# Patient Record
Sex: Female | Born: 2000
Health system: Southern US, Community
[De-identification: ages and names within clinical notes are randomized; demographics above are authoritative.]

## PROBLEM LIST (undated history)

## (undated) DIAGNOSIS — G540 Brachial plexus disorders: Secondary | ICD-10-CM

---
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
## 2019-07-05 DIAGNOSIS — Z419 Encounter for procedure for purposes other than remedying health state, unspecified: Secondary | ICD-10-CM | POA: Diagnosis not present

## 2019-08-05 DIAGNOSIS — Z419 Encounter for procedure for purposes other than remedying health state, unspecified: Secondary | ICD-10-CM | POA: Diagnosis not present

## 2019-09-05 DIAGNOSIS — Z419 Encounter for procedure for purposes other than remedying health state, unspecified: Secondary | ICD-10-CM | POA: Diagnosis not present

## 2019-10-05 DIAGNOSIS — Z419 Encounter for procedure for purposes other than remedying health state, unspecified: Secondary | ICD-10-CM | POA: Diagnosis not present

## 2019-11-05 DIAGNOSIS — Z419 Encounter for procedure for purposes other than remedying health state, unspecified: Secondary | ICD-10-CM | POA: Diagnosis not present

## 2019-12-05 DIAGNOSIS — Z419 Encounter for procedure for purposes other than remedying health state, unspecified: Secondary | ICD-10-CM | POA: Diagnosis not present

## 2020-01-05 DIAGNOSIS — Z419 Encounter for procedure for purposes other than remedying health state, unspecified: Secondary | ICD-10-CM | POA: Diagnosis not present

## 2020-02-05 DIAGNOSIS — Z419 Encounter for procedure for purposes other than remedying health state, unspecified: Secondary | ICD-10-CM | POA: Diagnosis not present

## 2020-03-04 DIAGNOSIS — Z419 Encounter for procedure for purposes other than remedying health state, unspecified: Secondary | ICD-10-CM | POA: Diagnosis not present

## 2020-04-04 DIAGNOSIS — Z419 Encounter for procedure for purposes other than remedying health state, unspecified: Secondary | ICD-10-CM | POA: Diagnosis not present

## 2020-05-04 DIAGNOSIS — Z419 Encounter for procedure for purposes other than remedying health state, unspecified: Secondary | ICD-10-CM | POA: Diagnosis not present

## 2020-06-04 DIAGNOSIS — Z419 Encounter for procedure for purposes other than remedying health state, unspecified: Secondary | ICD-10-CM | POA: Diagnosis not present

## 2020-07-04 DIAGNOSIS — Z419 Encounter for procedure for purposes other than remedying health state, unspecified: Secondary | ICD-10-CM | POA: Diagnosis not present

## 2020-08-04 DIAGNOSIS — Z419 Encounter for procedure for purposes other than remedying health state, unspecified: Secondary | ICD-10-CM | POA: Diagnosis not present

## 2020-09-04 DIAGNOSIS — Z419 Encounter for procedure for purposes other than remedying health state, unspecified: Secondary | ICD-10-CM | POA: Diagnosis not present

## 2020-10-04 DIAGNOSIS — Z419 Encounter for procedure for purposes other than remedying health state, unspecified: Secondary | ICD-10-CM | POA: Diagnosis not present

## 2020-11-04 DIAGNOSIS — Z419 Encounter for procedure for purposes other than remedying health state, unspecified: Secondary | ICD-10-CM | POA: Diagnosis not present

## 2020-12-04 DIAGNOSIS — Z419 Encounter for procedure for purposes other than remedying health state, unspecified: Secondary | ICD-10-CM | POA: Diagnosis not present

## 2021-01-04 DIAGNOSIS — Z419 Encounter for procedure for purposes other than remedying health state, unspecified: Secondary | ICD-10-CM | POA: Diagnosis not present

## 2021-02-04 DIAGNOSIS — Z419 Encounter for procedure for purposes other than remedying health state, unspecified: Secondary | ICD-10-CM | POA: Diagnosis not present

## 2021-03-04 DIAGNOSIS — Z419 Encounter for procedure for purposes other than remedying health state, unspecified: Secondary | ICD-10-CM | POA: Diagnosis not present

## 2021-04-04 DIAGNOSIS — Z419 Encounter for procedure for purposes other than remedying health state, unspecified: Secondary | ICD-10-CM | POA: Diagnosis not present

## 2021-05-04 DIAGNOSIS — Z419 Encounter for procedure for purposes other than remedying health state, unspecified: Secondary | ICD-10-CM | POA: Diagnosis not present

## 2021-06-04 DIAGNOSIS — Z419 Encounter for procedure for purposes other than remedying health state, unspecified: Secondary | ICD-10-CM | POA: Diagnosis not present

## 2021-07-04 DIAGNOSIS — Z419 Encounter for procedure for purposes other than remedying health state, unspecified: Secondary | ICD-10-CM | POA: Diagnosis not present

## 2021-08-04 DIAGNOSIS — Z419 Encounter for procedure for purposes other than remedying health state, unspecified: Secondary | ICD-10-CM | POA: Diagnosis not present

## 2021-09-04 DIAGNOSIS — Z419 Encounter for procedure for purposes other than remedying health state, unspecified: Secondary | ICD-10-CM | POA: Diagnosis not present

## 2021-10-04 DIAGNOSIS — Z419 Encounter for procedure for purposes other than remedying health state, unspecified: Secondary | ICD-10-CM | POA: Diagnosis not present

## 2021-11-04 DIAGNOSIS — Z419 Encounter for procedure for purposes other than remedying health state, unspecified: Secondary | ICD-10-CM | POA: Diagnosis not present

## 2021-12-04 DIAGNOSIS — Z419 Encounter for procedure for purposes other than remedying health state, unspecified: Secondary | ICD-10-CM | POA: Diagnosis not present

## 2022-01-04 DIAGNOSIS — Z419 Encounter for procedure for purposes other than remedying health state, unspecified: Secondary | ICD-10-CM | POA: Diagnosis not present

## 2022-02-04 DIAGNOSIS — Z419 Encounter for procedure for purposes other than remedying health state, unspecified: Secondary | ICD-10-CM | POA: Diagnosis not present

## 2022-02-20 DIAGNOSIS — R001 Bradycardia, unspecified: Secondary | ICD-10-CM | POA: Diagnosis not present

## 2022-02-20 DIAGNOSIS — N201 Calculus of ureter: Secondary | ICD-10-CM | POA: Diagnosis not present

## 2022-02-20 DIAGNOSIS — R109 Unspecified abdominal pain: Secondary | ICD-10-CM | POA: Diagnosis not present

## 2022-02-20 DIAGNOSIS — R1084 Generalized abdominal pain: Secondary | ICD-10-CM | POA: Diagnosis not present

## 2022-02-20 DIAGNOSIS — R55 Syncope and collapse: Secondary | ICD-10-CM | POA: Diagnosis not present

## 2022-02-20 DIAGNOSIS — Z743 Need for continuous supervision: Secondary | ICD-10-CM | POA: Diagnosis not present

## 2022-03-05 DIAGNOSIS — Z419 Encounter for procedure for purposes other than remedying health state, unspecified: Secondary | ICD-10-CM | POA: Diagnosis not present

## 2022-04-05 DIAGNOSIS — Z419 Encounter for procedure for purposes other than remedying health state, unspecified: Secondary | ICD-10-CM | POA: Diagnosis not present

## 2022-05-05 DIAGNOSIS — Z419 Encounter for procedure for purposes other than remedying health state, unspecified: Secondary | ICD-10-CM | POA: Diagnosis not present

## 2023-02-21 ENCOUNTER — Encounter (HOSPITAL_COMMUNITY): Payer: Self-pay

## 2023-02-21 ENCOUNTER — Emergency Department (HOSPITAL_COMMUNITY)
Admission: EM | Admit: 2023-02-21 | Discharge: 2023-02-21 | Disposition: A | Discharge status: Home / Self Care | Payer: Self-pay | Attending: Emergency Medicine | Admitting: Emergency Medicine

## 2023-02-21 ENCOUNTER — Emergency Department (HOSPITAL_COMMUNITY): Payer: Self-pay

## 2023-02-21 ENCOUNTER — Other Ambulatory Visit: Payer: Self-pay

## 2023-02-21 DIAGNOSIS — R5383 Other fatigue: Secondary | ICD-10-CM

## 2023-02-21 DIAGNOSIS — R0789 Other chest pain: Secondary | ICD-10-CM | POA: Insufficient documentation

## 2023-02-21 DIAGNOSIS — G43809 Other migraine, not intractable, without status migrainosus: Secondary | ICD-10-CM

## 2023-02-21 DIAGNOSIS — R079 Chest pain, unspecified: Secondary | ICD-10-CM

## 2023-02-21 DIAGNOSIS — R040 Epistaxis: Secondary | ICD-10-CM

## 2023-02-21 HISTORY — DX: Brachial plexus disorders: G54.0

## 2023-02-21 LAB — TROPONIN I (HIGH SENSITIVITY)
Troponin I (High Sensitivity): <2 ng/L (ref <18)
Troponin I (High Sensitivity): <2 ng/L (ref <18)

## 2023-02-21 LAB — BASIC METABOLIC PANEL
Anion gap: 13 (ref 5–15)
BUN: 10 mg/dL (ref 6–20)
CO2: 21 mmol/L — ABNORMAL LOW (ref 22–32)
Calcium: 9.7 mg/dL (ref 8.9–10.3)
Chloride: 105 mmol/L (ref 98–111)
Creatinine, Ser: 0.62 mg/dL (ref 0.44–1.00)
GFR, Estimated: >60 mL/min (ref >60)
Glucose, Bld: 83 mg/dL (ref 70–99)
Potassium: 4.1 mmol/L (ref 3.5–5.1)
Sodium: 139 mmol/L (ref 135–145)

## 2023-02-21 LAB — CBC
HCT: 39.2 % (ref 36.0–46.0)
Hemoglobin: 13.3 g/dL (ref 12.0–15.0)
MCH: 29.8 pg (ref 26.0–34.0)
MCHC: 33.9 g/dL (ref 30.0–36.0)
MCV: 87.7 fL (ref 80.0–100.0)
Platelets: 377 10*3/uL (ref 150–400)
RBC: 4.47 MIL/uL (ref 3.87–5.11)
RDW: 12.1 % (ref 11.5–15.5)
WBC: 9.2 10*3/uL (ref 4.0–10.5)
nRBC: 0 % (ref 0.0–0.2)

## 2023-02-21 LAB — HCG, SERUM, QUALITATIVE: Preg, Serum: NEGATIVE

## 2023-02-21 MED ORDER — SUMATRIPTAN SUCCINATE 100 MG PO TABS: 100.0000 mg | ORAL_TABLET | ORAL | 0 refills | Status: AC | PRN

## 2023-02-21 NOTE — ED Provider Notes (Signed)
 [facility] AT [facility] Provider Note   CSN: [id number] Arrival date & time: 02/21/23  1210     History  Chief Complaint  Patient presents with   Fatigue    Tamara Leon is a 23 y.o. female presenting with fatigue and nosebleed and headaches.  Patient states that she has been very fatigued for the last several months.  She states that she had over 6 nosebleeds over the last several months and she feels very tired after them.  Patient states that she has some chest tightness and headache as well going on for several days.  She went to urgent care was sent here for further evaluation.   The history is provided by the patient.       Home Medications Prior to Admission medications   Medication Sig Start Date End Date Taking? Authorizing Provider  methocarbamol (ROBAXIN) 500 MG tablet Take 1 tablet (500 mg total) by mouth 2 (two) times daily. 05/27/15   Joycie Peek, PA-C      Allergies    Patient has no known allergies.    Review of Systems   Review of Systems  Constitutional:  Positive for fatigue.  Neurological:  Positive for headaches.  All other systems reviewed and are negative.   Physical Exam Updated Vital Signs BP 138/89 (BP Location: Right Arm)   Pulse 72   Temp 98 F (36.7 C) (Oral)   Resp 16   Ht 5\' 4"  (1.626 m)   Wt 66.2 kg   SpO2 100%   BMI 25.06 kg/m  Physical Exam Vitals and nursing note reviewed.  Constitutional:      Appearance: Normal appearance.     Comments: Well-appearing  HENT:     Head: Normocephalic.     Nose: Nose normal.     Comments: No active nosebleed    Mouth/Throat:     Mouth: Mucous membranes are moist.  Eyes:     Extraocular Movements: Extraocular movements intact.     Pupils: Pupils are equal, round, and reactive to light.  Cardiovascular:     Rate and Rhythm: Normal rate and regular rhythm.     Pulses: Normal pulses.     Heart sounds: Normal heart sounds.  Pulmonary:     Effort:  Pulmonary effort is normal.     Breath sounds: Normal breath sounds.  Abdominal:     General: Abdomen is flat.     Palpations: Abdomen is soft.  Musculoskeletal:        General: Normal range of motion.     Cervical back: Normal range of motion and neck supple.  Skin:    General: Skin is warm.     Capillary Refill: Capillary refill takes less than 2 seconds.  Neurological:     General: No focal deficit present.     Mental Status: She is alert and oriented to person, place, and time.  Psychiatric:        Mood and Affect: Mood normal.        Behavior: Behavior normal.     ED Results / Procedures / Treatments   Labs (all labs ordered are listed, but only abnormal results are displayed) Labs Reviewed  BASIC METABOLIC PANEL - Abnormal; Notable for the following components:      Result Value   CO2 21 (*)    All other components within normal limits  CBC  HCG, SERUM, QUALITATIVE  TROPONIN I (HIGH SENSITIVITY)  TROPONIN I (HIGH SENSITIVITY)  EKG None  Radiology DG Chest 1 View Result Date: 02/21/2023 CLINICAL DATA:  Chest pain. EXAM: CHEST  1 VIEW COMPARISON:  None Available. FINDINGS: The heart size and mediastinal contours are within normal limits. No focal consolidation, sizeable pleural effusion, or pneumothorax. No acute osseous abnormality. IMPRESSION: No acute cardiopulmonary findings. Electronically Signed   By: Hart Robinsons M.D.   On: 02/21/2023 15:43   CT Head Wo Contrast Result Date: 02/21/2023 CLINICAL DATA:  Headache, increasing frequency or severity. Nose bleed. Headache. Chest pain. EXAM: CT HEAD WITHOUT CONTRAST TECHNIQUE: Contiguous axial images were obtained from the base of the skull through the vertex without intravenous contrast. RADIATION DOSE REDUCTION: This exam was performed according to the departmental dose-optimization program which includes automated exposure control, adjustment of the mA and/or kV according to patient size and/or use of iterative  reconstruction technique. COMPARISON:  None Available. FINDINGS: Brain: The brain shows a normal appearance without evidence of malformation, atrophy, old or acute small or large vessel infarction, mass lesion, hemorrhage, hydrocephalus or extra-axial collection. Vascular: No hyperdense vessel. No evidence of atherosclerotic calcification. Skull: Normal.  No traumatic finding.  No focal bone lesion. Sinuses/Orbits: Sinuses are clear. Orbits appear normal. Mastoids are clear. Other: Visualized nasal passages appear clear and normal. No mastoid or middle ear fluid. IMPRESSION: Normal head CT. Electronically Signed   By: Paulina Fusi M.D.   On: 02/21/2023 14:53    Procedures Procedures    Medications Ordered in ED Medications - No data to display  ED Course/ Medical Decision Making/ A&P                                 Medical Decision Making Tamara Leon is a 23 y.o. female here presenting with nosebleed and fatigue and headaches and also chest pain.  I reviewed workup done in the triage area.  Patient had 2 negative troponins.  Patient's platelet count and hemoglobin is normal.  Patient is not pregnant.  CT head unremarkable.  I think patient likely has migraines.  I think the nosebleed is likely secondary to the dry air during the winter.  Recommend humidifiers at night and will refer to neurology outpatient for migraines.  Will start on Imitrex as needed   Problems Addressed: Chest pain, unspecified type: acute illness or injury Other fatigue: acute illness or injury Other migraine without status migrainosus, not intractable: acute illness or injury  Amount and/or Complexity of Data Reviewed Labs: ordered. Decision-making details documented in ED Course. Radiology: ordered and independent interpretation performed. Decision-making details documented in ED Course.    Final Clinical Impression(s) / ED Diagnoses Final diagnoses:  None    Rx / DC Orders ED Discharge Orders     None          Charlynne Pander, MD 02/21/23 1649

## 2023-02-21 NOTE — ED Notes (Signed)
 Pt states she has had 6-8 nose bleeds throughout the month. Pt says the chest tightness has eased off. Dr. Silverio Lay at the bedside discussing care

## 2023-02-21 NOTE — ED Triage Notes (Signed)
 Reports was sitting in class and got severe headache and chest tightness.  Reports now feels out of it and lethargic and just wants to go to sleep.  Reports nosebleeds on an off for months.  Went to UC and was told to come here for FULL workup.  Patient has tremors noted and reports she has thoracic outlet syndrome.

## 2023-02-21 NOTE — Discharge Instructions (Addendum)
 Please take Tylenol or Motrin for headaches.  I have prescribed Imitrex as needed  I have referred you to neurology for headaches.  For your nosebleed, I recommend Afrin as needed and please use a emitted fire at night  You need to see your primary care doctor for further evaluation of your fatigue  Return to ER if you have worse headaches or chest pain or uncontrolled nosebleed

## 2023-02-21 NOTE — ED Provider Triage Note (Cosign Needed Addendum)
 Emergency Medicine Provider Triage Evaluation Note  Tamara Leon , a 23 y.o. female  was evaluated in triage.  Pt complains of chest tightness and headache.  Has been going on for 2.5 months.  Chest tightness in the center of her chest.  Headache is all over.  Was worse today.  Denies fever.  Denies shortness of breath.  Review of Systems  Positive: See above Negative: See above  Physical Exam  BP 138/89 (BP Location: Right Arm)   Pulse 72   Temp 98 F (36.7 C) (Oral)   Resp 16   Ht 5\' 4"  (1.626 m)   Wt 66.2 kg   SpO2 100%   BMI 25.06 kg/m  Gen:   Awake, no distress   Resp:  Normal effort  MSK:   Moves extremities without difficulty  Other:    Medical Decision Making  Medically screening exam initiated at 12:32 PM.  Appropriate orders placed.  EVELIA WASKEY was informed that the remainder of the evaluation will be completed by another provider, this initial triage assessment does not replace that evaluation, and the importance of remaining in the ED until their evaluation is complete.  Work up started    Gareth Eagle, PA-C 02/21/23 1233

## 2023-02-21 NOTE — ED Notes (Signed)
Called 3x for vitals, no answer 

## 2023-02-22 ENCOUNTER — Encounter: Payer: Self-pay | Admitting: Neurology

## 2023-07-01 NOTE — Progress Notes (Deleted)
 NEUROLOGY CONSULTATION NOTE  Tamara Leon MRN: [id number] DOB: Nov 30, 2000  Referring provider: Alm Sherwin Cave, MD (ED referral) Primary care provider: Marinell Charles, MD  Reason for consult:  headache  Assessment/Plan:   ***   Subjective:  Tamara Leon is a 23 year old female with thoracic outlet syndrome who presents for headache.  History supplemented by ED note.  CT head personally reviewed.    Since last year, she has been experiencing fatigue and nosebleeds as well.  ***.  In February 2025, she began experiencing headaches ***.  She also endorsed chest pain.  She was seen in the ED on 02/21/2023.  CXR and troponins were negative and EKG normal.   CT head was normal.  where she was diagnosed with migraines and told that her nose bleeds were likely secondary to dry air.    Past NSAIDS/analgesics:  *** Past abortive triptans:  *** Past abortive ergotamine:  *** Past muscle relaxants:  *** Past anti-emetic:  *** Past antihypertensive medications:  *** Past antidepressant medications:  *** Past anticonvulsant medications:  *** Past anti-CGRP:  *** Past vitamins/Herbal/Supplements:  *** Past antihistamines/decongestants:  *** Other past therapies:  ***  Current NSAIDS/analgesics:  *** Current triptans:  *** Current ergotamine:  *** Current anti-emetic:  *** Current muscle relaxants:  *** Current Antihypertensive medications:  *** Current Antidepressant medications:  *** Current Anticonvulsant medications:  *** Current anti-CGRP:  *** Current Vitamins/Herbal/Supplements:  *** Current Antihistamines/Decongestants:  *** Other therapy:  *** Birth control:  *** Other medications:  ***   Caffeine:  *** Alcohol:  *** Smoker:  *** Diet:  *** Exercise:  *** Depression:  ***; Anxiety:  *** Other pain:  *** Sleep hygiene:  *** Family history of headache:  ***      PAST MEDICAL HISTORY: Past Medical History:  Diagnosis Date   Thoracic outlet syndrome      PAST SURGICAL HISTORY: No past surgical history on file.  MEDICATIONS: Current Outpatient Medications on File Prior to Visit  Medication Sig Dispense Refill   methocarbamol  (ROBAXIN ) 500 MG tablet Take 1 tablet (500 mg total) by mouth 2 (two) times daily. 20 tablet 0   SUMAtriptan  (IMITREX ) 100 MG tablet Take 1 tablet (100 mg total) by mouth every 2 (two) hours as needed for migraine. May repeat in 2 hours if headache persists or recurs. 10 tablet 0   No current facility-administered medications on file prior to visit.    ALLERGIES: No Known Allergies  FAMILY HISTORY: No family history on file.  Objective:  *** General: No acute distress.  Patient appears well-groomed.   Head:  Normocephalic/atraumatic Eyes:  fundi examined but not visualized Neck: supple, no paraspinal tenderness, full range of motion Back: No paraspinal tenderness Heart: regular rate and rhythm Lungs: Clear to auscultation bilaterally. Vascular: No carotid bruits. Neurological Exam: Mental status: alert and oriented to person, place, and time, speech fluent and not dysarthric, language intact. Cranial nerves: CN I: not tested CN II: pupils equal, round and reactive to light, visual fields intact CN III, IV, VI:  full range of motion, no nystagmus, no ptosis CN V: facial sensation intact. CN VII: upper and lower face symmetric CN VIII: hearing intact CN IX, X: gag intact, uvula midline CN XI: sternocleidomastoid and trapezius muscles intact CN XII: tongue midline Bulk & Tone: normal, no fasciculations. Motor:  muscle strength 5/5 throughout Sensation:  Pinprick, temperature and vibratory sensation intact. Deep Tendon Reflexes:  2+ throughout,  toes downgoing.   Finger  to nose testing:  Without dysmetria.   Heel to shin:  Without dysmetria.   Gait:  Normal station and stride.  Romberg negative.    Thank you for allowing me to take part in the care of this patient.  Tamara Dunnings, DO  CC:  ***

## 2023-07-04 ENCOUNTER — Ambulatory Visit: Payer: Self-pay | Admitting: Neurology
